# Patient Record
Sex: Male | Born: 2005 | Hispanic: No | Marital: Single | Smoking: Never smoker
Health system: Southern US, Community
[De-identification: ages and names within clinical notes are randomized; demographics above are authoritative.]

## PROBLEM LIST (undated history)

## (undated) DIAGNOSIS — F801 Expressive language disorder: Secondary | ICD-10-CM

## (undated) DIAGNOSIS — F909 Attention-deficit hyperactivity disorder, unspecified type: Secondary | ICD-10-CM

## (undated) DIAGNOSIS — F819 Developmental disorder of scholastic skills, unspecified: Secondary | ICD-10-CM

## (undated) DIAGNOSIS — T783XXA Angioneurotic edema, initial encounter: Secondary | ICD-10-CM

## (undated) HISTORY — DX: Attention-deficit hyperactivity disorder, unspecified type: F90.9

## (undated) HISTORY — DX: Developmental disorder of scholastic skills, unspecified: F81.9

## (undated) HISTORY — DX: Angioneurotic edema, initial encounter: T78.3XXA

## (undated) HISTORY — DX: Expressive language disorder: F80.1

---
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
## 2024-03-18 ENCOUNTER — Encounter: Payer: Self-pay | Admitting: Allergy & Immunology

## 2024-03-18 ENCOUNTER — Ambulatory Visit (INDEPENDENT_AMBULATORY_CARE_PROVIDER_SITE_OTHER): Payer: MEDICAID | Admitting: Allergy & Immunology

## 2024-03-18 VITALS — BP 120/82 | HR 64 | Temp 97.8°F | Wt 180.2 lb

## 2024-03-18 DIAGNOSIS — J3089 Other allergic rhinitis: Secondary | ICD-10-CM | POA: Diagnosis not present

## 2024-03-18 DIAGNOSIS — J302 Other seasonal allergic rhinitis: Secondary | ICD-10-CM | POA: Diagnosis not present

## 2024-03-18 DIAGNOSIS — L235 Allergic contact dermatitis due to other chemical products: Secondary | ICD-10-CM

## 2024-03-18 MED ORDER — EUCRISA 2 % EX OINT: 1.0000 | TOPICAL_OINTMENT | Freq: Two times a day (BID) | CUTANEOUS | 5 refills | Status: AC | PRN

## 2024-03-18 NOTE — Patient Instructions (Addendum)
 1. 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  - Safe list provided at the last visit. - Continue with Eucrisa  twice daily as needed.   2. Seasonal and perennial allergic rhinitis - Continue with Allegra daily. - Continue with Claritin as needed at night.  - Continue with Pataday as needed.  - We could always consider doing allergy shots in the future if needed.   3. Return in about 1 year (around 03/18/2025). You can have the follow up appointment with Dr. Iva or a Nurse Practicioner (our Nurse Practitioners are excellent and always have Physician oversight!). You can always make another appointment earlier if needed.   If you want to take screenshots and email them to me: [email or web address]   Please inform us  of any Emergency Department visits, hospitalizations, or changes in symptoms. Call us  before going to the ED for breathing or allergy symptoms since we might be able to fit you in for a sick visit. Feel free to contact us  anytime with any questions, problems, or concerns.  It was a pleasure to see you and your family again today!  Websites that have reliable patient information: 1. American Academy of Asthma, Allergy, and Immunology: www.aaaai.org 2. Food Allergy Research and Education (FARE): foodallergy.org 3. Mothers of Asthmatics: http://www.asthmacommunitynetwork.org 4. American College of Allergy, Asthma, and Immunology: www.acaai.org      "Like" us  on Facebook and Instagram for our latest updates!      A healthy democracy works best when [employer] participate! Make sure you are registered to vote! If you have moved or changed any of  your contact information, you will need to get this updated before voting! Scan the QR codes below to learn more!

## 2024-03-18 NOTE — Progress Notes (Signed)
 FOLLOW UP  Date of Service/Encounter:  03/18/24   Assessment:   Periorbital swelling - likely related to dust mites, although he also had testing that was positive to multiple other items on the list   Seasonal and perennial allergic rhinitis  Allergic contact dermatitis - with positive patch testing to Cinnamal, Methyldibromo glutaronitrile, METHYLISOTHIAZOLINONE, Neomycin sulfate, Nickel(II) sulfate hexahydrate, p-Phenylenediamine (PPD), Sodium Metabisulfite, 3-(Dimethylamino)-1-propylamine, Imidazolidinyl urea, Caine mix III, and N-Isopropyl-N-phenyl--4-phenylenediamine (IPPD)   Plan/Recommendations:   1. 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  - Safe list provided at the last visit. - Continue with Eucrisa  twice daily as needed.   2. Seasonal and perennial allergic rhinitis - Continue with Allegra daily. - Continue with Claritin as needed at night.  - Continue with Pataday as needed.  - We could always consider doing allergy shots in the future if needed.   3. Return in about 1 year (around 03/18/2025). You can have the follow up appointment with Dr. Iva or a Nurse Practicioner (our Nurse Practitioners are excellent and always have Physician oversight!). You can always make another appointment earlier if needed.   Subjective:   Jeremy [name] is a 18 y.o. male presenting today for follow up of  Chief Complaint  Patient presents with   Follow-up    Mom and pt state overall symptoms have improved.    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  Discussed the use of AI scribe software for clinical note transcription with the patient and/or guardian, who gave verbal consent to proceed.  Jeremy [name] is a 18 y.o. male presenting for a follow up visit.  He was last seen in August at which time he underwent patch testing.  He underwent patch testing in August.  He was positive to Cinnamal, Methyldibromo glutaronitrile, METHYLISOTHIAZOLINONE, Neomycin sulfate, Nickel(II) sulfate hexahydrate, p-Phenylenediamine (PPD), Sodium Metabisulfite, 3-(Dimethylamino)-1-propylamine, Imidazolidinyl urea, Caine mix III, and N-Isopropyl-N-phenyl--4-phenylenediamine (IPPD).   Since last visit, he has done very well.  He underwent patch testing which revealed numerous positive reactions. He was unable to tolerate the patches for the full 24 hours due to severe discomfort, describing the sensation as 'my back is on fire', leading to premature removal of the patches. Despite this, the testing revealed significant allergic responses.  Following the patch testing, he experienced severe allergic reactions, including swelling and redness around his eyes, which he described as looking like a 'big old poof ball' or 'Blowfish'. These symptoms have improved with avoidance of allergens and the use of medications. He has been using Allegra daily and Claritin on most nights to manage his symptoms. Additionally, he has been using Pataday eye drops and Eucrisa  cream, which have been effective in reducing the eye symptoms and dermatitis.  He has a history  of allergic reactions, including postnasal drip and runny nose, which are well-controlled  with Allegra. He also uses montelukast in the morning and an inhaler as needed. He takes Adderall  in the evening and clonidine  at night for sleep.  No significant family history related to allergies. No significant postnasal drip or runny nose recently, and his eyes were not as bad as before. He did not take Allegra on the day of the visit, which resulted in watery eyes.  We discussed allergy shots in the past.    Otherwise, there have been no changes to his past medical history, surgical history, family history, or social history.    Review of systems otherwise negative other than that mentioned in the HPI.    Objective:   Blood pressure 120/82, pulse 64, temperature 97.8 F (36.6 C), temperature source Temporal, weight 180 lb 3.2 oz (81.7 kg), SpO2 98%. Body mass index is 26.09 kg/m.    Physical Exam Vitals reviewed.  Constitutional:      Appearance: He is well-developed.     Comments: Quiet.   HENT:     Head: Normocephalic and atraumatic.     Right Ear: Tympanic membrane, ear canal and external ear normal. No drainage, swelling or tenderness. Tympanic membrane is not injected, scarred, erythematous, retracted or bulging.     Left Ear: Tympanic membrane, ear canal and external ear normal. No drainage, swelling or tenderness. Tympanic membrane is not injected, scarred, erythematous, retracted or bulging.     Nose: No nasal deformity, septal deviation, mucosal edema or rhinorrhea.     Right Turbinates: Enlarged, swollen and pale.     Left Turbinates: Enlarged, swollen and pale.     Right Sinus: No maxillary sinus tenderness or frontal sinus tenderness.     Left Sinus: No maxillary sinus tenderness or frontal sinus tenderness.     Comments: No polyps.    Mouth/Throat:     Mouth: Mucous membranes are not pale and not dry.     Pharynx: Uvula midline.  Eyes:     General: Lids are normal.  Allergic shiner present.        Right eye: No discharge or hordeolum.        Left eye: No discharge or hordeolum.     Conjunctiva/sclera: Conjunctivae normal.     Right eye: Right conjunctiva is not injected. No chemosis.    Left eye: Left conjunctiva is not injected. No chemosis.    Pupils: Pupils are equal, round, and reactive to light.     Comments: The erythema around the bilateral eyes looks much better than last time.  There is still some hyperkeratotic skin noted, but it certainly is not as angry as it was.  Cardiovascular:     Rate and Rhythm: Normal rate and regular rhythm.     Heart sounds: Normal heart sounds.  Pulmonary:     Effort: Pulmonary effort is normal. No tachypnea, accessory muscle usage or respiratory distress.     Breath sounds: Normal breath sounds. No wheezing, rhonchi or rales.     Comments: Moving air well in all lung fields. No increased work of breathing.  Chest:     Chest wall: No tenderness.  Abdominal:     Tenderness: There is no abdominal tenderness. There is no guarding or rebound.  Lymphadenopathy:     Head:     Right side of head: No submandibular, tonsillar or occipital adenopathy.     Left side of head: No submandibular, tonsillar or occipital adenopathy.     Cervical: No cervical adenopathy.  Skin:    General:  Skin is warm.     Capillary Refill: Capillary refill takes less than 2 seconds.     Coloration: Skin is not pale.     Findings: Rash present. No abrasion, erythema or petechiae. Rash is scaling and urticarial. Rash is not papular or vesicular.     Comments: Periorbital edema and erythema around both eyes.   Neurological:     Mental Status: He is alert.  Psychiatric:        Behavior: Behavior is cooperative.      Diagnostic studies: none        Marty Shaggy, MD  [facility] 

## 2024-03-31 NOTE — Progress Notes (Signed)
 We reviewed outside records.  He had an environmental allergy panel that was positive to multiple indoor and outdoor allergens with dust mites being the most significant.  This was collected in April 2025.  He had an extensive food panel that was low positive to multiple foods on the panel.  Nothing was greater than 0.7.   Results will be scanned into the system.

## 2025-03-24 ENCOUNTER — Ambulatory Visit: Payer: MEDICAID | Admitting: Allergy & Immunology
